# Patient Record
Sex: Male | Born: 1985 | Race: White | Hispanic: No | Marital: Single | Smoking: Current every day smoker
Health system: Southern US, Community
[De-identification: ages and names within clinical notes are randomized; demographics above are authoritative.]

---
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
## 2007-04-20 ENCOUNTER — Emergency Department (HOSPITAL_COMMUNITY): Admission: EM | Admit: 2007-04-20 | Discharge: 2007-04-20 | Payer: Self-pay | Admitting: Emergency Medicine

## 2009-10-01 ENCOUNTER — Emergency Department (HOSPITAL_COMMUNITY): Admission: EM | Admit: 2009-10-01 | Discharge: 2009-10-01 | Payer: Self-pay | Admitting: Emergency Medicine

## 2009-12-25 ENCOUNTER — Emergency Department (HOSPITAL_COMMUNITY): Admission: EM | Admit: 2009-12-25 | Discharge: 2009-12-25 | Payer: Self-pay | Admitting: Emergency Medicine

## 2013-03-15 ENCOUNTER — Encounter (HOSPITAL_COMMUNITY): Payer: Self-pay | Admitting: *Deleted

## 2013-03-15 ENCOUNTER — Emergency Department (HOSPITAL_COMMUNITY)
Admission: EM | Admit: 2013-03-15 | Discharge: 2013-03-15 | Disposition: A | Discharge status: Home / Self Care | Payer: Self-pay | Attending: Emergency Medicine | Admitting: Emergency Medicine

## 2013-03-15 DIAGNOSIS — M549 Dorsalgia, unspecified: Secondary | ICD-10-CM

## 2013-03-15 DIAGNOSIS — Z79899 Other long term (current) drug therapy: Secondary | ICD-10-CM | POA: Insufficient documentation

## 2013-03-15 DIAGNOSIS — F17200 Nicotine dependence, unspecified, uncomplicated: Secondary | ICD-10-CM | POA: Insufficient documentation

## 2013-03-15 DIAGNOSIS — M545 Low back pain, unspecified: Secondary | ICD-10-CM | POA: Insufficient documentation

## 2013-03-15 MED ORDER — MELOXICAM 7.5 MG PO TABS: ORAL_TABLET | ORAL | Status: DC

## 2013-03-15 MED ORDER — HYDROCODONE-ACETAMINOPHEN 5-325 MG PO TABS: 1.0000 | ORAL_TABLET | ORAL | Status: DC | PRN

## 2013-03-15 MED ORDER — BACLOFEN 10 MG PO TABS: 10.0000 mg | ORAL_TABLET | Freq: Three times a day (TID) | ORAL | Status: AC

## 2013-03-15 NOTE — ED Provider Notes (Signed)
History    CSN: [id number] Arrival date & time 03/15/13  1315  First MD Initiated Contact with Patient 03/15/13 1358     Chief Complaint  Patient presents with  . Back Pain   (Consider location/radiation/quality/duration/timing/severity/associated sxs/prior Treatment) Patient is a 27 y.o. male presenting with back pain. The history is provided by the patient.  Back Pain Location:  Lumbar spine Quality:  Aching and cramping Pain severity:  Moderate Pain is:  Same all the time Onset quality:  Gradual Timing:  Intermittent Progression:  Worsening Chronicity:  Recurrent Context: lifting heavy objects   Context comment:  Pulling heavy electrical wire. Relieved by:  Nothing Worsened by:  Movement and twisting Ineffective treatments:  NSAIDs Associated symptoms: no abdominal pain, no bladder incontinence, no bowel incontinence, no chest pain, no dysuria, no perianal numbness and no weakness    History reviewed. No pertinent past medical history. History reviewed. No pertinent past surgical history. History reviewed. No pertinent family history. History  Substance Use Topics  . Smoking status: Current Every Day Smoker -- 1.00 packs/day    Types: Cigarettes  . Smokeless tobacco: Not on file  . Alcohol Use: No    Review of Systems  Constitutional: Negative for activity change.       All ROS Neg except as noted in HPI  HENT: Negative for nosebleeds and neck pain.   Eyes: Negative for photophobia and discharge.  Respiratory: Negative for cough, shortness of breath and wheezing.   Cardiovascular: Negative for chest pain and palpitations.  Gastrointestinal: Negative for abdominal pain, blood in stool and bowel incontinence.  Genitourinary: Negative for bladder incontinence, dysuria, frequency and hematuria.  Musculoskeletal: Positive for back pain. Negative for arthralgias.  Skin: Negative.   Neurological: Negative for dizziness, seizures, speech difficulty and weakness.   Psychiatric/Behavioral: Negative for hallucinations and confusion.    Allergies  Review of patient's allergies indicates no known allergies.  Home Medications   Current Outpatient Rx  Name  Route  Sig  Dispense  Refill  . ibuprofen (ADVIL,MOTRIN) 200 MG tablet   Oral   Take 600 mg by mouth every 6 (six) hours as needed for pain.         . baclofen (LIORESAL) 10 MG tablet   Oral   Take 1 tablet (10 mg total) by mouth 3 (three) times daily.   21 each   0   . HYDROcodone-acetaminophen (NORCO/VICODIN) 5-325 MG per tablet   Oral   Take 1 tablet by mouth every 4 (four) hours as needed for pain.   20 tablet   0   . meloxicam (MOBIC) 7.5 MG tablet      1 po bid with food   12 tablet   0    BP 119/73  Pulse 80  Temp(Src) 97.7 F (36.5 C) (Oral)  Resp 18  Ht 5\' 5"  (1.651 m)  Wt 140 lb (63.504 kg)  BMI 23.3 kg/m2  SpO2 100% Physical Exam  Nursing note and vitals reviewed. Constitutional: He is oriented to person, place, and time. He appears well-developed and well-nourished.  Non-toxic appearance.  HENT:  Head: Normocephalic.  Right Ear: Tympanic membrane and external ear normal.  Left Ear: Tympanic membrane and external ear normal.  Eyes: EOM and lids are normal. Pupils are equal, round, and reactive to light.  Neck: Normal range of motion. Neck supple. Carotid bruit is not present.  Cardiovascular: Normal rate, regular rhythm, normal heart sounds, intact distal pulses and normal pulses.   Pulmonary/Chest: Breath  sounds normal. No respiratory distress.  Abdominal: Soft. Bowel sounds are normal. There is no tenderness. There is no guarding.  Musculoskeletal: Normal range of motion.  There is pain with attempted range of motion of the lumbar spine area. There no hot areas appreciated. There is no palpable step off. There is some mild to moderate spasm of the paraspinal area.  Lymphadenopathy:       Head (right side): No submandibular adenopathy present.       Head  (left side): No submandibular adenopathy present.    He has no cervical adenopathy.  Neurological: He is alert and oriented to person, place, and time. He has normal strength. No cranial nerve deficit or sensory deficit.  Skin: Skin is warm and dry.  Psychiatric: He has a normal mood and affect. His speech is normal.    ED Course  Procedures (including critical care time) Labs Reviewed - No data to display No results found. 1. 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 There the last 4 months he has been noticing increasing pain in his lower lumbar region. The patient states he does not have a primary physician, so he came to the emergency department for evaluation of this problem. No gross neurologic deficits appreciated on examination at this time.  Plan - Rx for baclofen, norco, and mobic given to the patient. Pt to follow up with orthopedics  Kathie Dike, PA-C 03/18/13 1610

## 2013-03-15 NOTE — ED Notes (Signed)
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

## 2013-03-15 NOTE — ED Notes (Signed)
Patient w/hx of lumbar back pain x 4 months, was pulling wire today and had onset of excrutiating pain almost dropping him to knees. Reports he has not been taking any medications for pain.

## 2013-03-18 NOTE — ED Provider Notes (Signed)
Medical screening examination/treatment/procedure(s) were performed by non-physician practitioner and as supervising physician I was immediately available for consultation/collaboration.  Charles B. Sheldon, MD 03/18/13 1656 

## 2013-12-16 ENCOUNTER — Emergency Department (HOSPITAL_COMMUNITY): Payer: No Typology Code available for payment source

## 2013-12-16 ENCOUNTER — Emergency Department (HOSPITAL_COMMUNITY)
Admission: EM | Admit: 2013-12-16 | Discharge: 2013-12-16 | Disposition: A | Discharge status: Home / Self Care | Payer: No Typology Code available for payment source | Attending: Emergency Medicine | Admitting: Emergency Medicine

## 2013-12-16 ENCOUNTER — Encounter (HOSPITAL_COMMUNITY): Payer: Self-pay | Admitting: Emergency Medicine

## 2013-12-16 DIAGNOSIS — G8929 Other chronic pain: Secondary | ICD-10-CM | POA: Insufficient documentation

## 2013-12-16 DIAGNOSIS — Y92410 Unspecified street and highway as the place of occurrence of the external cause: Secondary | ICD-10-CM | POA: Insufficient documentation

## 2013-12-16 DIAGNOSIS — Y9389 Activity, other specified: Secondary | ICD-10-CM | POA: Insufficient documentation

## 2013-12-16 DIAGNOSIS — F17200 Nicotine dependence, unspecified, uncomplicated: Secondary | ICD-10-CM | POA: Insufficient documentation

## 2013-12-16 DIAGNOSIS — S161XXA Strain of muscle, fascia and tendon at neck level, initial encounter: Secondary | ICD-10-CM

## 2013-12-16 DIAGNOSIS — S8002XA Contusion of left knee, initial encounter: Secondary | ICD-10-CM

## 2013-12-16 DIAGNOSIS — S39012A Strain of muscle, fascia and tendon of lower back, initial encounter: Secondary | ICD-10-CM

## 2013-12-16 DIAGNOSIS — S8000XA Contusion of unspecified knee, initial encounter: Secondary | ICD-10-CM | POA: Insufficient documentation

## 2013-12-16 DIAGNOSIS — S335XXA Sprain of ligaments of lumbar spine, initial encounter: Secondary | ICD-10-CM | POA: Insufficient documentation

## 2013-12-16 DIAGNOSIS — S139XXA Sprain of joints and ligaments of unspecified parts of neck, initial encounter: Secondary | ICD-10-CM | POA: Insufficient documentation

## 2013-12-16 MED ORDER — HYDROCODONE-ACETAMINOPHEN 5-325 MG PO TABS: ORAL_TABLET | ORAL | Status: DC

## 2013-12-16 MED ORDER — IBUPROFEN 800 MG PO TABS
800.0000 mg | ORAL_TABLET | Freq: Once | ORAL | Status: AC
  Administered 2013-12-16: 800 mg via ORAL
  Filled 2013-12-16: qty 1

## 2013-12-16 MED ORDER — CYCLOBENZAPRINE HCL 10 MG PO TABS
10.0000 mg | ORAL_TABLET | Freq: Once | ORAL | Status: AC
  Administered 2013-12-16: 10 mg via ORAL
  Filled 2013-12-16: qty 1

## 2013-12-16 MED ORDER — IBUPROFEN 800 MG PO TABS: 800.0000 mg | ORAL_TABLET | Freq: Three times a day (TID) | ORAL | Status: DC

## 2013-12-16 MED ORDER — HYDROCODONE-ACETAMINOPHEN 5-325 MG PO TABS
1.0000 | ORAL_TABLET | Freq: Once | ORAL | Status: AC
  Administered 2013-12-16: 1 via ORAL
  Filled 2013-12-16: qty 1

## 2013-12-16 MED ORDER — CYCLOBENZAPRINE HCL 10 MG PO TABS: 10.0000 mg | ORAL_TABLET | Freq: Three times a day (TID) | ORAL | Status: DC | PRN

## 2013-12-16 NOTE — Discharge Instructions (Signed)
Cervical Sprain °A cervical sprain is an injury in the neck in which the strong, fibrous tissues (ligaments) that connect your neck bones stretch or tear. Cervical sprains can range from mild to severe. Severe cervical sprains can cause the neck vertebrae to be unstable. This can lead to damage of the spinal cord and can result in serious nervous system problems. The amount of time it takes for a cervical sprain to get better depends on the cause and extent of the injury. Most cervical sprains heal in 1 to 3 weeks. °CAUSES  °Severe cervical sprains may be caused by:  °· Contact sport injuries (such as from football, rugby, wrestling, hockey, auto racing, gymnastics, diving, martial arts, or boxing).   °· Motor vehicle collisions.   °· Whiplash injuries. This is an injury from a sudden forward-and backward whipping movement of the head and neck.  °· Falls.   °Mild cervical sprains may be caused by:  °· Being in an awkward position, such as while cradling a telephone between your ear and shoulder.   °· Sitting in a chair that does not offer proper support.   °· Working at a poorly designed computer station.   °· Looking up or down for long periods of time.   °SYMPTOMS  °· Pain, soreness, stiffness, or a burning sensation in the front, back, or sides of the neck. This discomfort may develop immediately after the injury or slowly, 24 hours or more after the injury.   °· Pain or tenderness directly in the middle of the back of the neck.   °· Shoulder or upper back pain.   °· Limited ability to move the neck.   °· Headache.   °· Dizziness.   °· Weakness, numbness, or tingling in the hands or arms.   °· Muscle spasms.   °· Difficulty swallowing or chewing.   °· Tenderness and swelling of the neck.   °DIAGNOSIS  °Most of the time your health care provider can diagnose a cervical sprain by taking your history and doing a physical exam. Your health care provider will ask about previous neck injuries and any known neck  problems, such as arthritis in the neck. X-rays may be taken to find out if there are any other problems, such as with the bones of the neck. Other tests, such as a CT scan or MRI, may also be needed.  °TREATMENT  °Treatment depends on the severity of the cervical sprain. Mild sprains can be treated with rest, keeping the neck in place (immobilization), and pain medicines. Severe cervical sprains are immediately immobilized. Further treatment is done to help with pain, muscle spasms, and other symptoms and may include: °· Medicines, such as pain relievers, numbing medicines, or muscle relaxants.   °· Physical therapy. This may involve stretching exercises, strengthening exercises, and posture training. Exercises and improved posture can help stabilize the neck, strengthen muscles, and help stop symptoms from returning.   °HOME CARE INSTRUCTIONS  °· Put ice on the injured area.   °· Put ice in a plastic bag.   °· Place a towel between your skin and the bag.   °· Leave the ice on for 15 20 minutes, 3 4 times a day.   °· If your injury was severe, you may have been given a cervical collar to wear. A cervical collar is a two-piece collar designed to keep your neck from moving while it heals. °· Do not remove the collar unless instructed by your health care provider. °· If you have long hair, keep it outside of the collar. °· Ask your health care provider before making any adjustments to your collar.   Minor adjustments may be required over time to improve comfort and reduce pressure on your chin or on the back of your head.  Ifyou are allowed to remove the collar for cleaning or bathing, follow your health care provider's instructions on how to do so safely.  Keep your collar clean by wiping it with mild soap and water and drying it completely. If the collar you have been given includes removable pads, remove them every 1 2 days and hand wash them with soap and water. Allow them to air dry. They should be completely  dry before you wear them in the collar.  If you are allowed to remove the collar for cleaning and bathing, wash and dry the skin of your neck. Check your skin for irritation or sores. If you see any, tell your health care provider.  Do not drive while wearing the collar.   Only take over-the-counter or prescription medicines for pain, discomfort, or fever as directed by your health care provider.   Keep all follow-up appointments as directed by your health care provider.   Keep all physical therapy appointments as directed by your health care provider.   Make any needed adjustments to your workstation to promote good posture.   Avoid positions and activities that make your symptoms worse.   Warm up and stretch before being active to help prevent problems.  SEEK MEDICAL CARE IF:   Your pain is not controlled with medicine.   You are unable to decrease your pain medicine over time as planned.   Your activity level is not improving as expected.  SEEK IMMEDIATE MEDICAL CARE IF:   You develop any bleeding.  You develop stomach upset.  You have signs of an allergic reaction to your medicine.   Your symptoms get worse.   You develop new, unexplained symptoms.   You have numbness, tingling, weakness, or paralysis in any part of your body.  MAKE SURE YOU:   Understand these instructions.  Will watch your condition.  Will get help right away if you are not doing well or get worse. Document Released: 06/13/2007 Document Revised: 06/06/2013 Document Reviewed: 02/21/2013 [facility] Patient Information 2014 [place], [place].  Contusion A contusion is a deep bruise. Contusions happen when an injury causes bleeding under the skin. Signs of bruising include pain, puffiness (swelling), and discolored skin. The contusion may turn blue, purple, or yellow. HOME CARE   Put ice on the injured area.  Put ice in a plastic bag.  Place a towel between your skin and the  bag.  Leave the ice on for 15-20 minutes, 03-04 times a day.  Only take medicine as told by your doctor.  Rest the injured area.  If possible, raise (elevate) the injured area to lessen puffiness. GET HELP RIGHT AWAY IF:   You have more bruising or puffiness.  You have pain that is getting worse.  Your puffiness or pain is not helped by medicine. MAKE SURE YOU:   Understand these instructions.  Will watch your condition.  Will get help right away if you are not doing well or get worse. Document Released: 02/02/2008 Document Revised: 11/08/2011 Document Reviewed: 06/21/2011 [facility] Patient Information 2014 [place], [place].
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
## 2013-12-17 NOTE — ED Provider Notes (Signed)
Medical screening examination/treatment/procedure(s) were performed by non-physician practitioner and as supervising physician I was immediately available for consultation/collaboration.   EKG Interpretation None       Hurman HornJohn M Leeyah Heather, MD 12/17/13 2206

## 2013-12-20 NOTE — ED Provider Notes (Signed)
Medical screening examination/treatment/procedure(s) were performed by non-physician practitioner and as supervising physician I was immediately available for consultation/collaboration.   EKG Interpretation None       Climmie Cronce M Kasidee Voisin, MD 12/20/13 2331 

## 2014-06-01 ENCOUNTER — Emergency Department (HOSPITAL_COMMUNITY)
Admission: EM | Admit: 2014-06-01 | Discharge: 2014-06-01 | Disposition: A | Discharge status: Home / Self Care | Payer: No Typology Code available for payment source | Attending: Emergency Medicine | Admitting: Emergency Medicine

## 2014-06-01 ENCOUNTER — Encounter (HOSPITAL_COMMUNITY): Payer: Self-pay | Admitting: Emergency Medicine

## 2014-06-01 DIAGNOSIS — Y92410 Unspecified street and highway as the place of occurrence of the external cause: Secondary | ICD-10-CM | POA: Diagnosis not present

## 2014-06-01 DIAGNOSIS — Z720 Tobacco use: Secondary | ICD-10-CM | POA: Diagnosis not present

## 2014-06-01 DIAGNOSIS — S199XXA Unspecified injury of neck, initial encounter: Secondary | ICD-10-CM | POA: Insufficient documentation

## 2014-06-01 DIAGNOSIS — S46812A Strain of other muscles, fascia and tendons at shoulder and upper arm level, left arm, initial encounter: Secondary | ICD-10-CM | POA: Diagnosis not present

## 2014-06-01 DIAGNOSIS — Y9389 Activity, other specified: Secondary | ICD-10-CM | POA: Insufficient documentation

## 2014-06-01 DIAGNOSIS — S4992XA Unspecified injury of left shoulder and upper arm, initial encounter: Secondary | ICD-10-CM | POA: Diagnosis present

## 2014-06-01 MED ORDER — METHOCARBAMOL 500 MG PO TABS: 500.0000 mg | ORAL_TABLET | Freq: Three times a day (TID) | ORAL | Status: AC

## 2014-06-01 MED ORDER — ACETAMINOPHEN-CODEINE #3 300-30 MG PO TABS: 1.0000 | ORAL_TABLET | Freq: Four times a day (QID) | ORAL | Status: AC | PRN

## 2014-06-01 MED ORDER — DICLOFENAC SODIUM 75 MG PO TBEC: 75.0000 mg | DELAYED_RELEASE_TABLET | Freq: Two times a day (BID) | ORAL | Status: AC
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
## 2014-06-01 NOTE — ED Notes (Signed)
Patient given discharge instruction, verbalized understand. Patient ambulatory out of the department.  
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
## 2014-06-01 NOTE — ED Provider Notes (Signed)
Medical screening examination/treatment/procedure(s) were performed by non-physician practitioner and as supervising physician I was immediately available for consultation/collaboration.  Flint MelterElliott L Windel Keziah, MD 06/01/14 1620
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
